# Patient Record
Sex: Male | Born: 1990 | Race: Black or African American | Hispanic: No | Marital: Single | Smoking: Never smoker
Health system: Southern US, Community
[De-identification: ages and names within clinical notes are randomized; demographics above are authoritative.]

---
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
## 2011-10-14 ENCOUNTER — Emergency Department (HOSPITAL_COMMUNITY)
Admission: EM | Admit: 2011-10-14 | Discharge: 2011-10-14 | Disposition: A | Discharge status: Home / Self Care | Payer: BC Managed Care – PPO | Attending: Emergency Medicine | Admitting: Emergency Medicine

## 2011-10-14 ENCOUNTER — Encounter (HOSPITAL_COMMUNITY): Payer: Self-pay | Admitting: *Deleted

## 2011-10-14 ENCOUNTER — Emergency Department (HOSPITAL_COMMUNITY): Payer: BC Managed Care – PPO

## 2011-10-14 DIAGNOSIS — R079 Chest pain, unspecified: Secondary | ICD-10-CM | POA: Insufficient documentation

## 2011-10-14 DIAGNOSIS — R071 Chest pain on breathing: Secondary | ICD-10-CM | POA: Insufficient documentation

## 2011-10-14 DIAGNOSIS — F17200 Nicotine dependence, unspecified, uncomplicated: Secondary | ICD-10-CM | POA: Insufficient documentation

## 2011-10-14 DIAGNOSIS — R0781 Pleurodynia: Secondary | ICD-10-CM

## 2011-10-14 MED ORDER — NAPROXEN 500 MG PO TABS: 500.0000 mg | ORAL_TABLET | Freq: Two times a day (BID) | ORAL | Status: AC

## 2011-10-14 MED ORDER — KETOROLAC TROMETHAMINE 60 MG/2ML IM SOLN
60.0000 mg | Freq: Once | INTRAMUSCULAR | Status: AC
  Administered 2011-10-14: 60 mg via INTRAMUSCULAR
  Filled 2011-10-14: qty 2

## 2011-10-14 NOTE — Discharge Instructions (Signed)
Your caregiver has diagnosed you as having chest pain that is nonspecific for one problem. This means that after looking at you and examining you and ordering tests (such as blood work, chest x-rays and EKG), your caregiver does not believe that the problem is serious enough to need watching in the hospital. This judgment is often made after testing shows no acute heart attack and you are at low risk for sudden acute heart condition. Chest pain comes from many different causes.  Seek immediate medical attention if:   You have severe chest pain, especially if the pain is crushing or pressure-like and spreads to the arms, back, neck, or jaw, or if you have sweating, nausea, shortness of breath. This is an emergency. Don't wait to see if the pain will go away. Get medical help at once. Call 911 immediately. Do not drive herself to the hospital.   Your chest pain gets worse and does not go away with rest.   You have an attack of chest pain lasting longer than usual, despite rest and treatment with the medications your caregiver has prescribed   You awaken from sleep with chest pain or shortness of breath.   You feel faint or dizzy   You have chest pain not typical of your usual pain for which you originally saw your caregiver.  You must have a repeat evaluation within 24 hours for a recheck of your heart.  Please call your doctor this morning to schedule this appointment. If you do not have a family doctor, please see the list of doctors below.  RESOURCE GUIDE  Dental Problems  Patients with Medicaid: [facility]                     [facility] [address]                                           [address] Phone:  [phone]                                                  Phone:  [phone]  If unable to pay or uninsured, contact:  [facility] or [facility] to become qualified for the adult dental clinic.  Chronic Pain  Problems Contact [facility]  [phone] Patients need to be referred by their primary care doctor.  Insufficient Money for Medicine Contact [employer]:  call "211" or [facility] 271-5999.  No Primary Care Doctor Call [facility]  832-8000 Other agencies that provide inexpensive medical care    [facility]  [phone]    [facility]  [phone]    [facility]  [phone]    [facility]  832-4777    [facility]  [phone]    [facility]  [phone]  Psychological Services [facility]  [phone] [facility]  [phone] [facility]   800 853-5163 (emergency services 641-4993)  Substance Abuse Resources [facility]  336-882-2125 [facility] 336-784-9470 [facility] 336-285-9073 [facility] 336-845-3988 Residential & Outpatient Substance Abuse Program  800-659-3381  Abuse/Neglect [place] Child Abuse Hotline (336) 641-3795 [place] [facility] Hotline 800-378-5315 (After Hours)  Emergency Shelter [facility] (336) 271-5985  [facility] (336) 275-9566 [facility] (704) 372-4663  MRSA Hotline #:   832-7006    [place] [facility]  [facility] [place]     [employer]                          [facility] [address] [place]                       [address]      [address]  [place]                                                [place]                            [place] Phone:  [phone]                                   Phone:  [phone]                 Phone:  [phone]  [facility] Phone:  [phone]  [place] Child Abuse Hotline (336) 342-1394 (336) 342-3537 (After Hours)    

## 2011-10-14 NOTE — ED Notes (Signed)
Pt states understanding of discharge instructions 

## 2011-10-14 NOTE — ED Notes (Signed)
PT c/o intermittent non radiating CP, tender to palpation. Pain exacerbated by deep breath and arm movement.

## 2011-10-14 NOTE — ED Provider Notes (Signed)
History     CSN: [id number]  Arrival date & time 10/14/11  0010   First MD Initiated Contact with Patient 10/14/11 0029      Chief Complaint  Patient presents with  . Chest Pain    (Consider location/radiation/quality/duration/timing/severity/associated sxs/prior treatment) HPI Comments: 21 year old male with a history of no significant medical problems who does smoke cigarettes, and is very athletic including plain daily basketball or cardioactive electric jogging. He states that this evening while he was at work in [employer] he felt acute onset of left-sided sharp chest pain which is worse with taking a deep breath, worse with movement of his left arm and worse with palpation of his left chest wall. He denies any swelling of his legs, trauma, travel, injuries, immobilization, hormone therapy, family history or personal history of venous thromboembolism. He also denies any early cardiac history in his family and states that he partially takes no other prescription medications, smoked some marijuana and drinks no daily alcohol. Currently the pain is mild, worse with deep breathing. He admits that this does happen intermittently in the past but is only short lived and not this intense. A family members in the room with him and states there is no significant family history of aortic dissection, aneurysm, coronary disease or sudden cardiac death.  Patient is a 21 y.o. male presenting with chest pain. The history is provided by the patient.  Chest Pain     History reviewed. No pertinent past medical history.  History reviewed. No pertinent past surgical history.  No family history on file.  History  Substance Use Topics  . Smoking status: Current Everyday Smoker  . Smokeless tobacco: Not on file  . Alcohol Use: No      Review of Systems  Cardiovascular: Positive for chest pain.  All other systems reviewed and are negative.    Allergies  Review of patient's allergies  indicates no known allergies.  Home Medications   Current Outpatient Rx  Name Route Sig Dispense Refill  . NAPROXEN 500 MG PO TABS Oral Take 1 tablet (500 mg total) by mouth 2 (two) times daily with a meal. 30 tablet 0    BP 136/96  Pulse 68  Temp(Src) 98.8 F (37.1 C) (Oral)  Resp 20  SpO2 100%  Physical Exam  Nursing note and vitals reviewed. Constitutional: He appears well-developed and well-nourished. No distress.  HENT:  Head: Normocephalic and atraumatic.  Mouth/Throat: Oropharynx is clear and moist. No oropharyngeal exudate.  Eyes: Conjunctivae and EOM are normal. Pupils are equal, round, and reactive to light. Right eye exhibits no discharge. Left eye exhibits no discharge. No scleral icterus.  Neck: Normal range of motion. Neck supple. No JVD present. No thyromegaly present.  Cardiovascular: Normal rate, regular rhythm, normal heart sounds and intact distal pulses.  Exam reveals no gallop and no friction rub.   No murmur heard. Pulmonary/Chest: Effort normal and breath sounds normal. No respiratory distress. He has no wheezes. He has no rales. He exhibits tenderness ( Reproducible tenderness in the left parasternal area and the lower pectoralis.).  Abdominal: Soft. Bowel sounds are normal. He exhibits no distension and no mass. There is no tenderness.  Musculoskeletal: Normal range of motion. He exhibits no edema and no tenderness.       Tenderness to palpation of the chest, tenderness with range of motion of the left arm into extension of the pectoralis on the left  Lymphadenopathy:    He has no cervical adenopathy.  Neurological:  He is alert. Coordination normal.  Skin: Skin is warm and dry. No rash noted. No erythema.  Psychiatric: He has a normal mood and affect. His behavior is normal.    ED Course  Procedures (including critical care time)  Labs Reviewed - No data to display Dg Chest 2 View  10/14/2011  *RADIOLOGY REPORT*  Clinical Data: Chest pain.  Shortness  of breath.  Smoker.  CHEST - 2 VIEW  Comparison: None.  Findings: Normal sized heart.  Clear lungs.  Central peribronchial thickening.  Minimal scoliosis.  IMPRESSION: Moderate central bronchitic changes.  Original Report Authenticated By: Darrol Angel, M.D.     1. Pleuritic chest pain       MDM  No peripheral edema, no tachycardia, no hypoxia, normal lung sounds. There are no signs of right heart strain on the EKG, no wrist factors for pulmonary embolism, reproducible to palpation and range of motion of the left arm on my exam. I suspect this is a chest wall syndrome versus pleurisy, chest x-ray pending to rule out occult pneumothorax.  ED ECG REPORT   Date: 10/14/2011   Rate: 83  Rhythm: normal sinus rhythm  QRS Axis: normal  Intervals: normal  ST/T Wave abnormalities: normal  Conduction Disutrbances:none  Narrative Interpretation:   Old EKG Reviewed: none available  Chest x-ray reviewed showing no signs of acute injury, pneumothorax or abnormalities. Patient has been given intramuscular Toradol, stable for discharge, likely pleurisy. Low risk for pulmonary embolism.    Discharge Prescriptions include:  Naprosyn   Vida Roller, MD 10/14/11 [phone]

## 2011-10-14 NOTE — ED Notes (Signed)
MD Miller at bedside. 

## 2011-10-14 NOTE — ED Notes (Signed)
Lt chest pain for one hour sob nausea and dizziness??? He has a history of the same never saw a doctor

## 2012-10-27 ENCOUNTER — Ambulatory Visit: Payer: No Typology Code available for payment source

## 2012-10-27 ENCOUNTER — Ambulatory Visit (INDEPENDENT_AMBULATORY_CARE_PROVIDER_SITE_OTHER): Payer: No Typology Code available for payment source | Admitting: Internal Medicine

## 2012-10-27 VITALS — BP 120/65 | HR 77 | Temp 98.1°F | Resp 18 | Ht 73.5 in | Wt 174.0 lb

## 2012-10-27 DIAGNOSIS — S93609A Unspecified sprain of unspecified foot, initial encounter: Secondary | ICD-10-CM

## 2012-10-27 DIAGNOSIS — M79675 Pain in left toe(s): Secondary | ICD-10-CM

## 2012-10-27 DIAGNOSIS — M79609 Pain in unspecified limb: Secondary | ICD-10-CM

## 2012-10-27 DIAGNOSIS — S93502A Unspecified sprain of left great toe, initial encounter: Secondary | ICD-10-CM

## 2012-10-27 MED ORDER — IBUPROFEN 600 MG PO TABS: 600.0000 mg | ORAL_TABLET | Freq: Three times a day (TID) | ORAL | Status: DC | PRN

## 2012-10-27 NOTE — Patient Instructions (Signed)
Turf Toe with Rehab Injury to the base of the big toe (first metatarsal phalangeal joint) that causes damage to the joint capsule and ligaments is known as turf toe. Turf toe commonly occurs on the bottom side of the joint. SYMPTOMS   Pain, tenderness, inflammation and/or bruising around the big toe (contusion).  Pain that worsens with movement of the big toe, specifically when raising (extending) the toe.  Inability to walk properly on the affected foot, which causes one to limp. CAUSES  A force being placed on the joint capsule and ligaments that is greater than they can withstand causes turf toe. Common mechanisms of injury include:  Repetitive and/or strenuous extension of the big toe (ie. standing on tip toes).  Explosive running starts (ie. sprinters).  "Stubbing" the big toe.  Another player landing on your foot. RISK INCREASES WITH:  Previous toe injury.  Having a long first toe.  Flat feet.  Arthritis of the great toe.  Improperly fitted shoes or shoes that are not appropriate for a given activity.  Family history of foot abnormalities.  Activities that involve explosive running starts, standing on tip toes, or jumping. PREVENTION  Wear properly fitted shoes that are appropriate for the sport or activity.  Protect the first toe by taping it to reduce motion.  Maintain physical fitness:  Strength, flexibility, and endurance.  Cardiovascular fitness. PROGNOSIS  If treated properly, the symptoms of turf toes usually resolve with non-surgical (conservative) treatment. Occasionally surgery is necessary. RELATED COMPLICATIONS  Recurrent symptoms that result in a chronic problem.  Inability to compete in athletics.  Prolonged healing time, if improperly treated or re-injured.  Other foot injuries that occur due to protecting the first toe from pain.  Loss of motion in the first toe (hallux rigidus).  Bunion (hallux valgus). TREATMENT  Treatment  initially involves resting from any activities that aggravate the symptoms, and the use of ice and medications to help reduce pain and inflammation. The use of range-of-motion exercises may help reduce pain with activity. It is important that you wear properly fitted shoes with a stiff sole and a wide toe box, in order to reduce the pressure on the first toe. Protecting your big toe by taping it to restrict movement may allow you to return to sports earlier without pain or discomfort. If the condition becomes chronic, then your caregiver may recommend a corticosteroid injection to help reduce inflammation. If symptoms persist despite non-surgical treatment, then surgery may be recommended. MEDICATION  If pain medication is necessary, then nonsteroidal anti-inflammatory medications, such as aspirin and ibuprofen, or other minor pain relievers, such as acetaminophen, are often recommended.  Do not take pain medication for 7 days before surgery.  Prescription pain relievers may be given if deemed necessary by your caregiver. Use only as directed and only as much as you need.  Ointments applied to the skin may be helpful.  Corticosteroid injections may be given by your caregiver. These injections should be reserved for the most serious cases, because they may only be given a certain number of times. HEAT AND COLD  Cold treatment (icing) relieves pain and reduces inflammation. Cold treatment should be applied for 10 to 15 minutes every 2 to 3 hours for inflammation and pain and immediately after any activity that aggravates your symptoms. Use ice packs or massage the area with a piece of ice (ice massage).  Heat treatment may be used prior to performing the stretching and strengthening activities prescribed by your caregiver, physical therapist,  or Event organiser. Use a heat pack or soak the injury in warm water. SEEK MEDICAL CARE IF:  Treatment seems to offer no benefit, or the condition  worsens.  Any medications produce adverse side effects. EXERCISES RANGE OF MOTION (ROM) AND STRETCHING EXERCISES - Turf Toe These exercises may help you when beginning to rehabilitate your injury. Your symptoms may resolve with or without further involvement from your physician, physical therapist or athletic trainer. While completing these exercises, remember:  Restoring tissue flexibility helps normal motion to return to the joints. This allows healthier, less painful movement and activity.  An effective stretch should be held for at least 30 seconds.  A stretch should never be painful. You should only feel a gentle lengthening or release in the stretched tissue. RANGE OF MOTION - Toe Extension, Flexion  Sit with your right / left leg crossed over your opposite knee.  Grasp your toes and gently pull them back toward the top of your foot. You should feel a stretch on the bottom of your toes and/or foot.  Hold this stretch for __________ seconds.  Now, gently pull your toes toward the bottom of your foot. You should feel a stretch on the top of your toes and or foot.  Hold this stretch for __________ seconds. Repeat __________ times. Complete this stretch __________ times per day. RANGE OF MOTION- Ankle Plantar Flexion  Sit with your right / left leg crossed over your opposite knee.  Use your opposite hand to pull the top of your foot and toes toward you.  You should feel a gentle stretch on the top of your foot/ankle. Hold this position for __________ seconds. Repeat __________ times. Complete __________ times per day. STRENGTHENING EXERCISES - Turf Toe These exercises may help you when beginning to rehabilitate your injury. They may resolve your symptoms with or without further involvement from your physician, physical therapist or athletic trainer. While completing these exercises, remember:  Muscles can gain both the endurance and the strength needed for everyday activities  through controlled exercises.  Complete these exercises as instructed by your physician, physical therapist or athletic trainer. Progress with the resistance and repetition exercises only as your caregiver advises.  You may experience muscle soreness or fatigue, but the pain or discomfort you are trying to eliminate should never worsen during these exercises. If this pain does worsen, stop and make certain you are following the directions exactly. If the pain is still present after adjustments, discontinue the exercise until you can discuss the trouble with your clinician. STRENGTH - Towel Curls  Sit in a chair positioned on a non-carpeted surface.  Place your foot on a towel, keeping your heel on the floor.  Pull the towel toward your heel by only curling your toes. Keep your heel on the floor.  If instructed by your physician, physical therapist or athletic trainer, add ____________________ at the end of the towel. Repeat __________ times. Complete this exercise __________ times per day. Document Released: 04/30/2005 Document Revised: 07/23/2011 Document Reviewed: 08/12/2008 [facility] Patient Information 2014 [place], [place].

## 2012-10-27 NOTE — Progress Notes (Signed)
  Subjective:    Patient ID: Timothy [name], male    DOB: Jul 03, 1990, 22 y.o.   MRN: [id number]  HPI Patient presents today with left foot pain. He was playing basketball yesterday and he rolled it forward. He states that the ball of the foot at the big toe is very painful to put pressure on it. He has been walking on the outside of the foot. Pain and swelling at 1st mtpj. No previous injury to this area.   Review of Systems neg    Objective:   Physical Exam  Vitals reviewed. Constitutional: He is oriented to person, place, and time. He appears well-developed and well-nourished.  Eyes: EOM are normal.  Pulmonary/Chest: Effort normal.  Musculoskeletal:       Left foot: He exhibits decreased range of motion, tenderness, bony tenderness and swelling. He exhibits no crepitus, no deformity and no laceration.       Feet:  1st mtpj tender/swollen  Neurological: He is alert and oriented to person, place, and time. No cranial nerve deficit. He exhibits normal muscle tone. Coordination normal.  Skin: No erythema.  Psychiatric: He has a normal mood and affect.   [facility] reading (PRIMARY) by  Dr Perrin Maltese no fx seen         Assessment & Plan:  Camwalker/RICE/Motrin 600mg  Sprain/contusion 1st MTPJ and great toe

## 2012-12-22 ENCOUNTER — Ambulatory Visit: Payer: No Typology Code available for payment source

## 2012-12-22 ENCOUNTER — Ambulatory Visit (INDEPENDENT_AMBULATORY_CARE_PROVIDER_SITE_OTHER): Payer: No Typology Code available for payment source | Admitting: Family Medicine

## 2012-12-22 VITALS — BP 110/72 | HR 66 | Temp 98.8°F | Resp 16

## 2012-12-22 DIAGNOSIS — S93409A Sprain of unspecified ligament of unspecified ankle, initial encounter: Secondary | ICD-10-CM

## 2012-12-22 DIAGNOSIS — S93401A Sprain of unspecified ligament of right ankle, initial encounter: Secondary | ICD-10-CM

## 2012-12-22 DIAGNOSIS — M25579 Pain in unspecified ankle and joints of unspecified foot: Secondary | ICD-10-CM

## 2012-12-22 DIAGNOSIS — M25571 Pain in right ankle and joints of right foot: Secondary | ICD-10-CM

## 2012-12-22 MED ORDER — HYDROCODONE-ACETAMINOPHEN 5-325 MG PO TABS: 1.0000 | ORAL_TABLET | Freq: Four times a day (QID) | ORAL | Status: DC | PRN

## 2012-12-22 NOTE — Progress Notes (Signed)
  Subjective:    Patient ID: Timothy [name], male    DOB: 08-Apr-1991, 22 y.o.   MRN: [id number] Chief Complaint  Patient presents with  . Ankle Injury    Right ankle yesterday while playing basketball    HPI   Was playing basketball yesterday night and after jumping he landed on someone else's foot and rolled in right ankle in.  Initially limped home but after he went home, swelling and pain increased so can no longer bear weight. Has a CAM boot at home but it was to painful to put on.  Very hard time sleeping due to pain.  Has been icing it overnight.  History reviewed. No pertinent past medical history. No current outpatient prescriptions on file prior to visit.   No current facility-administered medications on file prior to visit.   No Known Allergies   Review of Systems  Constitutional: Negative for fever, chills, diaphoresis, activity change and appetite change.  Musculoskeletal: Positive for joint swelling and arthralgias. Negative for gait problem.  Skin: Negative for color change, rash and wound.  Neurological: Positive for weakness. Negative for numbness.  Hematological: Negative for adenopathy. Does not bruise/bleed easily.      BP 110/72  Pulse 66  Temp(Src) 98.8 F (37.1 C)  Resp 16 Objective:   Physical Exam  Constitutional: He is oriented to person, place, and time. He appears well-developed and well-nourished. No distress.  In wheelchair, was not able to bear weight in office.  HENT:  Head: Normocephalic and atraumatic.  Eyes: No scleral icterus.  Pulmonary/Chest: Effort normal.  Musculoskeletal:       Right ankle: He exhibits decreased range of motion and swelling. He exhibits no ecchymosis and normal pulse. Tenderness. AITFL, posterior TFL and proximal fibula tenderness found. No lateral malleolus, no medial malleolus, no CF ligament and no head of 5th metatarsal tenderness found.  Neurological: He is alert and oriented to person, place, and time. Gait  abnormal.  Skin: Skin is warm and dry. He is not diaphoretic.  Psychiatric: He has a normal mood and affect. His behavior is normal.         [facility] reading (PRIMARY) by  Dr. Clelia Croft. Right ankle: No acute bony abnormality seen.  RADIOLOGY OVERREAD: Nondisplaced fracture of the distal fibular physis Assessment & Plan:  Pain in joint, ankle and foot, right - Plan: DG Ankle Complete Right  Ankle sprain, right, initial encounter Subtle distal fibular fracture - initially pt was treated w/ sprain - applied ace wrap and given crutches, advised RICE with non-weightbearing x 3d - after radiology overread was returned will change management plan to have pt wear CAM boot - he has one at home already from prior injury. Cont non-weightbearing x 5d, then touchdown weightbearing as tolerated. Recheck in 10-14d w/ repeat xray. Meds ordered this encounter  Medications  . HYDROcodone-acetaminophen (NORCO/VICODIN) 5-325 MG per tablet    Sig: Take 1 tablet by mouth every 6 (six) hours as needed for pain.    Dispense:  30 tablet    Refill:  0   DID NOT CHARGE FRACTURE CODE FOR OV AS FRACTURE NOT DIAGNOSED UNTIL AFTER OV

## 2012-12-22 NOTE — Patient Instructions (Addendum)
RICE:  R: Rest is achieved by limiting weightbearing. Use crutches until you are able to walk with a normal gait. I:   Ice or cold water immersion for 15 to 20 minutes every two to three hours for the first 48 hours or until swelling is improved, whichever comes first. C: Compression with an elastic bandage to minimize swelling should be applied early. E: Elevation - The injured ankle should be kept elevated above the level of the heart to further alleviate swelling.  Nonsteroidal antiinflammatory drugs (NSAIDs) can be used; no particular NSAID has been shown to be superior so you can using whatever you have at home - ibuprofen, aleve, motrin, advil, etc.    Exercises including pointing and flexing the foot and doing foot circles should be started early, once acute pain and swelling subside, to maintain range of motion. The intensity of rehabilitation is increased gradually.  Ankle splints or braces can limit extremes of joint motion and allow early weightbearing while protecting against reinjury.   Acute Ankle Sprain with Phase I Rehab An acute ankle sprain is a partial or complete tear in one or more of the ligaments of the ankle due to traumatic injury. The severity of the injury depends on both the the number of ligaments sprained and the grade of sprain. There are 3 grades of sprains.   A grade 1 sprain is a mild sprain. There is a slight pull without obvious tearing. There is no loss of strength, and the muscle and ligament are the correct length.  A grade 2 sprain is a moderate sprain. There is tearing of fibers within the substance of the ligament where it connects two bones or two cartilages. The length of the ligament is increased, and there is usually decreased strength.  A grade 3 sprain is a complete rupture of the ligament and is uncommon. In addition to the grade of sprain, there are three types of ankle sprains.  Lateral ankle sprains: This is a sprain of one or more of the three  ligaments on the outer side (lateral) of the ankle. These are the most common sprains. Medial ankle sprains: There is one large triangular ligament of the inner side (medial) of the ankle that is susceptible to injury. Medial ankle sprains are less common. Syndesmosis, "high ankle," sprains: The syndesmosis is the ligament that connects the two bones of the lower leg. Syndesmosis sprains usually only occur with very severe ankle sprains. SYMPTOMS  Pain, tenderness, and swelling in the ankle, starting at the side of injury that may progress to the whole ankle and foot with time.  "Pop" or tearing sensation at the time of injury.  Bruising that may spread to the heel.  Impaired ability to walk soon after injury. CAUSES   Acute ankle sprains are caused by trauma placed on the ankle that temporarily forces or pries the anklebone (talus) out of its normal socket.  Stretching or tearing of the ligaments that normally hold the joint in place (usually due to a twisting injury). RISK INCREASES WITH:  Previous ankle sprain.  Sports in which the foot may land awkwardly (ie. basketball, volleyball, or soccer) or walking or running on uneven or rough surfaces.  Shoes with inadequate support to prevent sideways motion when stress occurs.  Poor strength and flexibility.  Poor balance skills.  Contact sports. PREVENTION   Warm up and stretch properly before activity.  Maintain physical fitness:  Ankle and leg flexibility, muscle strength, and endurance.  Cardiovascular fitness.  Balance training activities.  Use proper technique and have a coach correct improper technique.  Taping, protective strapping, bracing, or high-top tennis shoes may help prevent injury. Initially, tape is best; however, it loses most of its support function within 10 to 15 minutes.  Wear proper fitted protective shoes (High-top shoes with taping or bracing is more effective than either alone).  Provide the  ankle with support during sports and practice activities for 12 months following injury. PROGNOSIS   If treated properly, ankle sprains can be expected to recover completely; however, the length of recovery depends on the degree of injury.  A grade 1 sprain usually heals enough in 5 to 7 days to allow modified activity and requires an average of 6 weeks to heal completely.  A grade 2 sprain requires 6 to 10 weeks to heal completely.  A grade 3 sprain requires 12 to 16 weeks to heal.  A syndesmosis sprain often takes more than 3 months to heal. RELATED COMPLICATIONS   Frequent recurrence of symptoms may result in a chronic problem. Appropriately addressing the problem the first time decreases the frequency of recurrence and optimizes healing time. Severity of the initial sprain does not predict the likelihood of later instability.  Injury to other structures (bone, cartilage, or tendon).  A chronically unstable or arthritic ankle joint is a possiblity with repeated sprains. TREATMENT Treatment initially involves the use of ice, medication, and compression bandages to help reduce pain and inflammation. Ankle sprains are usually immobilized in a walking cast or boot to allow for healing. Crutches may be recommended to reduce pressure on the injury. After immobilization, strengthening and stretching exercises may be necessary to regain strength and a full range of motion. Surgery is rarely needed to treat ankle sprains. MEDICATION   Nonsteroidal anti-inflammatory medications, such as aspirin and ibuprofen (do not take for the first 3 days after injury or within 7 days before surgery), or other minor pain relievers, such as acetaminophen, are often recommended. Take these as directed by your caregiver. Contact your caregiver immediately if any bleeding, stomach upset, or signs of an allergic reaction occur from these medications.  Ointments applied to the skin may be helpful.  Pain relievers  may be prescribed as necessary by your caregiver. Do not take prescription pain medication for longer than 4 to 7 days. Use only as directed and only as much as you need. HEAT AND COLD  Cold treatment (icing) is used to relieve pain and reduce inflammation for acute and chronic cases. Cold should be applied for 10 to 15 minutes every 2 to 3 hours for inflammation and pain and immediately after any activity that aggravates your symptoms. Use ice packs or an ice massage.  Heat treatment may be used before performing stretching and strengthening activities prescribed by your caregiver. Use a heat pack or a warm soak. SEEK IMMEDIATE MEDICAL CARE IF:   Pain, swelling, or bruising worsens despite treatment.  You experience pain, numbness, discoloration, or coldness in the foot or toes.  New, unexplained symptoms develop (drugs used in treatment may produce side effects.) EXERCISES  PHASE I EXERCISES RANGE OF MOTION (ROM) AND STRETCHING EXERCISES - Ankle Sprain, Acute Phase I, Weeks 1 to 2 These exercises may help you when beginning to restore flexibility in your ankle. You will likely work on these exercises for the 1 to 2 weeks after your injury. Once your physician, physical therapist, or athletic trainer sees adequate progress, he or she will advance your exercises. While  completing these exercises, remember:   Restoring tissue flexibility helps normal motion to return to the joints. This allows healthier, less painful movement and activity.  An effective stretch should be held for at least 30 seconds.  A stretch should never be painful. You should only feel a gentle lengthening or release in the stretched tissue. RANGE OF MOTION - Dorsi/Plantar Flexion  While sitting with your right / left knee straight, draw the top of your foot upwards by flexing your ankle. Then reverse the motion, pointing your toes downward.  Hold each position for __________ seconds.  After completing your first set  of exercises, repeat this exercise with your knee bent. Repeat __________ times. Complete this exercise __________ times per day.  RANGE OF MOTION - Ankle Alphabet  Imagine your right / left big toe is a pen.  Keeping your hip and knee still, write out the entire alphabet with your "pen." Make the letters as large as you can without increasing any discomfort. Repeat __________ times. Complete this exercise __________ times per day.  STRENGTHENING EXERCISES - Ankle Sprain, Acute -Phase I, Weeks 1 to 2 These exercises may help you when beginning to restore strength in your ankle. You will likely work on these exercises for 1 to 2 weeks after your injury. Once your physician, physical therapist, or athletic trainer sees adequate progress, he or she will advance your exercises. While completing these exercises, remember:   Muscles can gain both the endurance and the strength needed for everyday activities through controlled exercises.  Complete these exercises as instructed by your physician, physical therapist, or athletic trainer. Progress the resistance and repetitions only as guided.  You may experience muscle soreness or fatigue, but the pain or discomfort you are trying to eliminate should never worsen during these exercises. If this pain does worsen, stop and make certain you are following the directions exactly. If the pain is still present after adjustments, discontinue the exercise until you can discuss the trouble with your clinician. STRENGTH - Dorsiflexors  Secure a rubber exercise band/tubing to a fixed object (ie. table, pole) and loop the other end around your right / left foot.  Sit on the floor facing the fixed object. The band/tubing should be slightly tense when your foot is relaxed.  Slowly draw your foot back toward you using your ankle and toes.  Hold this position for __________ seconds. Slowly release the tension in the band and return your foot to the starting  position. Repeat __________ times. Complete this exercise __________ times per day.  STRENGTH - Plantar-flexors   Sit with your right / left leg extended. Holding onto both ends of a rubber exercise band/tubing, loop it around the ball of your foot. Keep a slight tension in the band.  Slowly push your toes away from you, pointing them downward.  Hold this position for __________ seconds. Return slowly, controlling the tension in the band/tubing. Repeat __________ times. Complete this exercise __________ times per day.  STRENGTH - Ankle Eversion  Secure one end of a rubber exercise band/tubing to a fixed object (table, pole). Loop the other end around your foot just before your toes.  Place your fists between your knees. This will focus your strengthening at your ankle.  Drawing the band/tubing across your opposite foot, slowly, pull your little toe out and up. Make sure the band/tubing is positioned to resist the entire motion.  Hold this position for __________ seconds. Have your muscles resist the band/tubing as it slowly pulls  your foot back to the starting position.  Repeat __________ times. Complete this exercise __________ times per day.  STRENGTH - Ankle Inversion  Secure one end of a rubber exercise band/tubing to a fixed object (table, pole). Loop the other end around your foot just before your toes.  Place your fists between your knees. This will focus your strengthening at your ankle.  Slowly, pull your big toe up and in, making sure the band/tubing is positioned to resist the entire motion.  Hold this position for __________ seconds.  Have your muscles resist the band/tubing as it slowly pulls your foot back to the starting position. Repeat __________ times. Complete this exercises __________ times per day.  STRENGTH - Towel Curls  Sit in a chair positioned on a non-carpeted surface.  Place your right / left foot on a towel, keeping your heel on the floor.  Pull the  towel toward your heel by only curling your toes. Keep your heel on the floor.  If instructed by your physician, physical therapist, or athletic trainer, add weight to the end of the towel. Repeat __________ times. Complete this exercise __________ times per day. Document Released: 11/29/2004 Document Revised: 07/23/2011 Document Reviewed: 08/12/2008 [facility] Patient Information 2014 [place], [place].

## 2012-12-24 ENCOUNTER — Telehealth: Payer: Self-pay | Admitting: Family Medicine

## 2012-12-24 NOTE — Telephone Encounter (Signed)
Notified Pt's mother that [name] has a fibula Fx and needs to be in his boot non-weight bearing until this weekend. Pt's mother verbalized understanding about the boot and crutches and agreed to try and reach her son. I also notified her that Timothy [name] can begin to put weight down on his foot this weekend and he needs to [facility] in 10-14 days for a recheck. [name]

## 2013-01-13 ENCOUNTER — Encounter: Payer: Self-pay | Admitting: Family Medicine

## 2013-01-29 ENCOUNTER — Telehealth: Payer: Self-pay | Admitting: Family Medicine

## 2013-01-29 NOTE — Telephone Encounter (Signed)
Called Pt on cell and home. No answer. Sent certified letter to return to clinic asap. [name]

## 2013-06-02 ENCOUNTER — Ambulatory Visit: Payer: No Typology Code available for payment source

## 2013-06-02 ENCOUNTER — Ambulatory Visit (INDEPENDENT_AMBULATORY_CARE_PROVIDER_SITE_OTHER): Payer: No Typology Code available for payment source | Admitting: Family Medicine

## 2013-06-02 ENCOUNTER — Encounter: Payer: Self-pay | Admitting: Family Medicine

## 2013-06-02 VITALS — BP 118/68 | HR 90 | Temp 98.5°F | Resp 17 | Ht 74.0 in | Wt 183.0 lb

## 2013-06-02 DIAGNOSIS — R079 Chest pain, unspecified: Secondary | ICD-10-CM

## 2013-06-02 MED ORDER — HYDROCODONE-ACETAMINOPHEN 5-325 MG PO TABS: 1.0000 | ORAL_TABLET | Freq: Four times a day (QID) | ORAL | Status: AC | PRN

## 2013-06-02 NOTE — Progress Notes (Signed)
23 [facility] young adult at [employer] who was playing basketball yesterday and took a shoulder to the chest and has had deep substernal chest pain since, made worse by deep breath or getting and sitting down.  Also more noticeable when using his arms.  Objective:  NAD Chest:  Tender mid sternum, lungs clear, ribs nontender Heart:  Reg, no murmur No sternal swelling [facility] reading (PRIMARY) by  Dr. Milus GlazierLauenstein:  Unfused sternal bones, otherwise unremarkable.  EKG: NSR  Chest pain - Plan: DG Chest 2 View, DG Chest 2 View, HYDROcodone-acetaminophen (NORCO) 5-325 MG per tablet Contusion to sternum   Signed, Elvina SidleKurt Helio Lack, MD

## 2014-04-19 ENCOUNTER — Ambulatory Visit (INDEPENDENT_AMBULATORY_CARE_PROVIDER_SITE_OTHER): Payer: Self-pay | Admitting: Emergency Medicine

## 2014-04-19 VITALS — BP 129/73 | HR 60 | Temp 98.1°F | Resp 18 | Ht 74.0 in | Wt 179.0 lb

## 2014-04-19 DIAGNOSIS — Z130 Encounter for screening for diseases of the blood and blood-forming organs and certain disorders involving the immune mechanism: Secondary | ICD-10-CM

## 2014-04-19 DIAGNOSIS — Z0189 Encounter for other specified special examinations: Secondary | ICD-10-CM

## 2014-04-19 DIAGNOSIS — Z025 Encounter for examination for participation in sport: Secondary | ICD-10-CM

## 2014-04-19 DIAGNOSIS — Z114 Encounter for screening for human immunodeficiency virus [HIV]: Secondary | ICD-10-CM

## 2014-04-19 LAB — POCT CBC
GRANULOCYTE PERCENT: 43.7 % (ref 37–80)
HCT, POC: 48.1 % (ref 43.5–53.7)
Hemoglobin: 15.7 g/dL (ref 14.1–18.1)
Lymph, poc: 1.4 (ref 0.6–3.4)
MCH: 28.9 pg (ref 27–31.2)
MCHC: 32.5 g/dL (ref 31.8–35.4)
MCV: 88.9 fL (ref 80–97)
MID (CBC): 0.3 (ref 0–0.9)
MPV: 7.6 fL (ref 0–99.8)
PLATELET COUNT, POC: 220 10*3/uL (ref 142–424)
POC Granulocyte: 1.4 — AB (ref 2–6.9)
POC LYMPH %: 46.2 % (ref 10–50)
POC MID %: 10.1 % (ref 0–12)
RBC: 5.41 M/uL (ref 4.69–6.13)
RDW, POC: 13.7 %
WBC: 3.1 10*3/uL — AB (ref 4.6–10.2)

## 2014-04-19 LAB — HIV ANTIBODY (ROUTINE TESTING W REFLEX): HIV: NONREACTIVE

## 2014-04-19 NOTE — Progress Notes (Addendum)
   Subjective:    Patient ID: Timothy [name], male    DOB: 12/11/1990, 23 y.o.   MRN: [id number] This chart was scribed for Lesle ChrisSteven Drinda Belgard, MD by Jolene Provostobert Halas, Medical Scribe. This patient was seen in Room 3 and the patient's care was started at 11:07 AM.  Chief Complaint  Patient presents with  . Annual Exam    sports    HPI HPI Comments: Timothy [name] is a 23 y.o. male who [facility] for a sports physical. Pt has no past surgical hx, or serious sports injuries. Pt does not smoke or drink. Pt works out regularly.     Review of Systems  Constitutional: Negative for fever and chills.  HENT: Negative for congestion.   Respiratory: Negative for shortness of breath.   Cardiovascular: Negative for palpitations and leg swelling.  Gastrointestinal: Negative for abdominal pain.       Objective:   Physical Exam  Constitutional: He is oriented to person, place, and time. He appears well-developed and well-nourished.  HENT:  Head: Normocephalic and atraumatic.  Eyes: Pupils are equal, round, and reactive to light.  Neck: Neck supple.  Cardiovascular: Normal rate and regular rhythm.   Pulmonary/Chest: Effort normal and breath sounds normal. No respiratory distress.  Neurological: He is alert and oriented to person, place, and time.  Skin: Skin is warm and dry.  Psychiatric: He has a normal mood and affect. His behavior is normal.  Nursing note and vitals reviewed.  Results for orders placed or performed in visit on 04/19/14  POCT CBC  Result Value Ref Range   WBC 3.1 (A) 4.6 - 10.2 K/uL   Lymph, poc 1.4 0.6 - 3.4   POC LYMPH PERCENT 46.2 10 - 50 %L   MID (cbc) 0.3 0 - 0.9   POC MID % 10.1 0 - 12 %M   POC Granulocyte 1.4 (A) 2 - 6.9   Granulocyte percent 43.7 37 - 80 %G   RBC 5.41 4.69 - 6.13 M/uL   Hemoglobin 15.7 14.1 - 18.1 g/dL   HCT, POC 08.648.1 57.843.5 - 53.7 %   MCV 88.9 80 - 97 fL   MCH, POC 28.9 27 - 31.2 pg   MCHC 32.5 31.8 - 35.4 g/dL   RDW, POC 46.913.7 %    Platelet Count, POC 220 142 - 424 K/uL   MPV 7.6 0 - 99.8 fL       Assessment & Plan:  White blood count is slightly low with a normal hemoglobin. Would suggest repeat CBC in 3-6 months. HIV test was done.. Patient is cleared for strenuous exercise. A sickle test was done and pending.I personally performed the services described in this documentation, which was scribed in my presence. The recorded information has been reviewed and is accurate.

## 2014-04-20 LAB — SICKLE CELL SCREEN: Sickle Cell Screen: NEGATIVE

## 2014-04-21 ENCOUNTER — Encounter: Payer: Self-pay | Admitting: Radiology

## 2015-09-23 ENCOUNTER — Encounter (HOSPITAL_COMMUNITY): Payer: Self-pay | Admitting: Vascular Surgery

## 2015-09-23 ENCOUNTER — Emergency Department (HOSPITAL_COMMUNITY)
Admission: EM | Admit: 2015-09-23 | Discharge: 2015-09-23 | Disposition: A | Discharge status: Home / Self Care | Payer: 59 | Attending: Emergency Medicine | Admitting: Emergency Medicine

## 2015-09-23 ENCOUNTER — Emergency Department (HOSPITAL_COMMUNITY): Payer: 59

## 2015-09-23 DIAGNOSIS — R0782 Intercostal pain: Secondary | ICD-10-CM | POA: Diagnosis not present

## 2015-09-23 DIAGNOSIS — R079 Chest pain, unspecified: Secondary | ICD-10-CM | POA: Diagnosis present

## 2015-09-23 DIAGNOSIS — M546 Pain in thoracic spine: Secondary | ICD-10-CM | POA: Insufficient documentation

## 2015-09-23 LAB — BASIC METABOLIC PANEL
ANION GAP: 12 (ref 5–15)
BUN: 7 mg/dL (ref 6–20)
CO2: 23 mmol/L (ref 22–32)
CREATININE: 1.45 mg/dL — AB (ref 0.61–1.24)
Calcium: 9.6 mg/dL (ref 8.9–10.3)
Chloride: 106 mmol/L (ref 101–111)
GFR calc non Af Amer: >60 mL/min (ref >60)
Glucose, Bld: 87 mg/dL (ref 65–99)
Potassium: 4.2 mmol/L (ref 3.5–5.1)
Sodium: 141 mmol/L (ref 135–145)

## 2015-09-23 LAB — CBC
HEMATOCRIT: 44.9 % (ref 39.0–52.0)
HEMOGLOBIN: 15.2 g/dL (ref 13.0–17.0)
MCH: 29.2 pg (ref 26.0–34.0)
MCHC: 33.9 g/dL (ref 30.0–36.0)
MCV: 86.2 fL (ref 78.0–100.0)
PLATELETS: 204 10*3/uL (ref 150–400)
RBC: 5.21 MIL/uL (ref 4.22–5.81)
RDW: 13.2 % (ref 11.5–15.5)
WBC: 3.1 10*3/uL — AB (ref 4.0–10.5)

## 2015-09-23 LAB — I-STAT TROPONIN, ED: Troponin i, poc: 0.01 ng/mL (ref 0.00–0.08)

## 2015-09-23 NOTE — ED Provider Notes (Signed)
CSN: [id number]     Arrival date & time 09/23/15  1121 History   First MD Initiated Contact with Patient 09/23/15 1230     Chief Complaint  Patient presents with  . Chest Pain     (Consider location/radiation/quality/duration/timing/severity/associated sxs/prior Treatment) HPI   Timothy [name] is a 25 y.o. male, patient with no pertinent past medical history, presenting to the ED with chest discomfort that began two days ago. Patient states he was doing an intense workout performing bicep curls. Pain began in his upper back between his shoulder blades and then later began in the center of his chest. Worse with deep breathing and sneezing. Rates the pain at 5/10, feels like a pressure/sharp, nonradiating. Hasn't taken medicine for the pain. No family history of SCD. Patient denies nausea/vomiting, cough, fever/chills, shortness of breath, or any other complaints.  History reviewed. No pertinent past medical history. History reviewed. No pertinent past surgical history. No family history on file. Social History  Substance Use Topics  . Smoking status: Never [name]   . Smokeless tobacco: None  . Alcohol Use: No    Review of Systems  Constitutional: Negative for fever and chills.  Respiratory: Negative for cough and shortness of breath.   Cardiovascular: Positive for chest pain.  Gastrointestinal: Negative for nausea, vomiting and abdominal pain.  Musculoskeletal: Positive for back pain.  Neurological: Negative for dizziness and light-headedness.  All other systems reviewed and are negative.     Allergies  Review of patient's allergies indicates no known allergies.  Home Medications   Prior to Admission medications   Medication Sig Start Date End Date Taking? Authorizing Provider  HYDROcodone-acetaminophen (NORCO) 5-325 MG per tablet Take 1 tablet by mouth every 6 (six) hours as needed for moderate pain. Patient not taking: Reported on 04/19/2014 06/02/13   Elvina SidleKurt Lauenstein, MD    BP 135/73 mmHg  Pulse 65  Temp(Src) 97.8 F (36.6 C) (Oral)  Resp 18  Ht 6\' 2"  (1.88 m)  Wt 83.008 kg  BMI 23.49 kg/m2  SpO2 100% Physical Exam  Constitutional: He appears well-developed and well-nourished. No distress.  HENT:  Head: Normocephalic and atraumatic.  Eyes: Conjunctivae are normal. Pupils are equal, round, and reactive to light.  Neck: Neck supple.  Cardiovascular: Normal rate, regular rhythm, normal heart sounds and intact distal pulses.   Pulmonary/Chest: Effort normal and breath sounds normal. No respiratory distress.  Patient voices worsening pain with deep breathing or any expansion of the chest.  Abdominal: Soft. There is no tenderness. There is no guarding.  Musculoskeletal: He exhibits no edema or tenderness.  Lymphadenopathy:    He has no cervical adenopathy.  Neurological: He is alert.  Skin: Skin is warm and dry. He is not diaphoretic.  Psychiatric: He has a normal mood and affect. His behavior is normal.  Nursing note and vitals reviewed.   ED Course  Procedures (including critical care time) Labs Review Labs Reviewed  BASIC METABOLIC PANEL - Abnormal; Notable for the following:    Creatinine, Ser 1.45 (*)    All other components within normal limits  CBC - Abnormal; Notable for the following:    WBC 3.1 (*)    All other components within normal limits  I-STAT TROPOININ, ED   WBC  Date Value Ref Range Status  09/23/2015 3.1* 4.0 - 10.5 K/uL Final  04/19/2014 3.1* 4.6 - 10.2 K/uL Final     Imaging Review Dg Chest 2 View  09/23/2015  CLINICAL DATA:  Chest pain for 3  days EXAM: CHEST  2 VIEW COMPARISON:  June 02, 2013 FINDINGS: Lungs are clear. Heart size and pulmonary vascularity are normal. No adenopathy. No pneumothorax. No bone lesions. IMPRESSION: No edema or consolidation. Electronically Signed   By: Bretta Bang III M.D.   On: 09/23/2015 12:17   I have personally reviewed and evaluated these images and lab results as part of  my medical decision-making.   EKG Interpretation None      MDM   Final diagnoses:  Intercostal pain    Timothy [name] presents with chest discomfort that began 2 days ago.  Patient has no red flag symptoms. Suspect muscle strain versus costochondritis. Suspect patient's increased creatinine is due to the chronic use of creatine without proper hydration. Patient was instructed on the importance of adequate hydration, especially when using supplements like creatine. Patient advised on treatment and return precautions. Patient voiced understanding of these instructions and is comfortable with discharge.   Filed Vitals:   09/23/15 1132 09/23/15 1251  BP: 129/74 135/73  Pulse: 98 65  Temp: 98.2 F (36.8 C) 97.8 F (36.6 C)  TempSrc: Oral Oral  Resp: 20 18  Height:  (1.88 m)   Weight: 83.008 kg   SpO2: 99% 100%      Anselm Pancoast, PA-C 09/23/15 1343  Gwyneth Sprout, MD 09/25/15 2028

## 2015-09-23 NOTE — Discharge Instructions (Signed)
You have been seen today for chest pain. Your imaging and lab tests showed no abnormalities. It is likely that the source of your pain is due to inflammation between the ribs, called costochondritis. Take 800 mg of ibuprofen every 8 hours or 500 mg of naproxen every 12 hours for the next 3 days. Take these anti-inflammatory medications with food to avoid upset stomach. Make sure to stay well hydrated. Follow up with PCP as needed. Return to ED should symptoms worsen.

## 2015-09-23 NOTE — ED Notes (Signed)
Pt reports to the ED for eval of CP. States he has been lifting at the gym recently and states that the pain started in his back and has come around to his chest. Describes the pain as a sharpness. Worse with deep breaths, movement, and cough/sneezing. Denies any N/V, lightheadedness, or dizziness. Reports some SOB with exertion. Hx of same 3 years ago but cannot remember dx. Pt A&Ox4, resp e/u, and skin warm and dry.

## 2016-04-07 ENCOUNTER — Ambulatory Visit: Payer: Self-pay

## 2016-05-17 ENCOUNTER — Emergency Department (HOSPITAL_COMMUNITY)
Admission: EM | Admit: 2016-05-17 | Discharge: 2016-05-17 | Disposition: A | Discharge status: Home / Self Care | Payer: 59 | Attending: Emergency Medicine | Admitting: Emergency Medicine

## 2016-05-17 ENCOUNTER — Emergency Department (HOSPITAL_COMMUNITY): Payer: 59

## 2016-05-17 ENCOUNTER — Encounter (HOSPITAL_COMMUNITY): Payer: Self-pay | Admitting: Emergency Medicine

## 2016-05-17 DIAGNOSIS — M79645 Pain in left finger(s): Secondary | ICD-10-CM | POA: Diagnosis present

## 2016-05-17 DIAGNOSIS — M20021 Boutonniere deformity of right finger(s): Secondary | ICD-10-CM | POA: Insufficient documentation

## 2016-05-17 DIAGNOSIS — Y9367 Activity, basketball: Secondary | ICD-10-CM | POA: Insufficient documentation

## 2016-05-17 DIAGNOSIS — W231XXA Caught, crushed, jammed, or pinched between stationary objects, initial encounter: Secondary | ICD-10-CM | POA: Diagnosis not present

## 2016-05-17 DIAGNOSIS — Y929 Unspecified place or not applicable: Secondary | ICD-10-CM | POA: Diagnosis not present

## 2016-05-17 DIAGNOSIS — Y999 Unspecified external cause status: Secondary | ICD-10-CM | POA: Insufficient documentation

## 2016-05-17 NOTE — ED Triage Notes (Signed)
Pt jammed R ring finger 2 months ago. Since then distal joint has been locked in hyperextension and middle joint has been locked in flexion. Has been to an UC 2x for same with no relief.

## 2016-05-17 NOTE — Discharge Instructions (Signed)
Read the information below.  You may return to the Emergency Department at any time for worsening condition or any new symptoms that concern you.  If you develop uncontrolled pain, weakness or numbness of the extremity, severe discoloration of the skin, or you are unable to feel or move your finger, return to the ER for a recheck.

## 2016-05-17 NOTE — ED Notes (Signed)
Bed: WTR7 Expected date:  Expected time:  Means of arrival:  Comments: 

## 2016-05-17 NOTE — ED Provider Notes (Signed)
[facility]-EMERGENCY DEPT Provider Note   CSN: [id number] Arrival date & time: 05/17/16  1015  By signing my name below, I, Majel Homereyton Lee, attest that this documentation has been prepared under the direction and in the presence of [facility] HospitalEmily Dietrich Samuelson, PA-C . Electronically Signed: Majel HomerPeyton Lee, Scribe. 05/17/2016. 10:44 AM .  History   Chief Complaint Chief Complaint  Patient presents with  . Finger Injury   The history is provided by the patient. No language interpreter was used.   HPI Comments: Timothy [name] is a 26 y.o. male who presents to the Emergency Department complaining of gradually worsening, right fourth digit pain s/p an injury that occurred ~2 months ago. Pt reports he was playing basketball when he attempted to pick up a bouncing ball when he suddenly "jammed" the tip of his fourth finger. He notes associated swelling to his middle joint and states his pain is exacerbated with flexion of his finger and palpation. He reports he is only able to "partially bend" his fourth finger and his middle joint is "locked in place." [facility] within the past month for his pain in which he received imaging and was prescribed steroids and told to ice his finger with no relief. He notes he did not receive any form of splint for his finger at these previous appointments. Pt reports he was given a referral for a hand specialist but was unable to make an appointment until 06/07/16 which is why he visited the ED today. He denies numbness in his extremities. Denies other injury.   History reviewed. No pertinent past medical history.  There are no active problems to display for this patient.  History reviewed. No pertinent surgical history.  Home Medications    Prior to Admission medications   Medication Sig Start Date End Date Taking? Authorizing Provider  HYDROcodone-acetaminophen (NORCO) 5-325 MG per tablet Take 1 tablet by mouth every 6 (six) hours as needed for moderate  pain. Patient not taking: Reported on 04/19/2014 06/02/13   Elvina SidleKurt Lauenstein, MD   Family History History reviewed. No pertinent family history.  Social History Social History  Substance Use Topics  . Smoking status: Never Smoker  . Smokeless tobacco: Not on file  . Alcohol use No   Allergies   Patient has no known allergies.  Review of Systems Review of Systems  Constitutional: Negative for fever.  Musculoskeletal: Positive for arthralgias and joint swelling.  Skin: Negative for color change, pallor, rash and wound.  Allergic/Immunologic: Negative for immunocompromised state.  Neurological: Positive for weakness. Negative for numbness.  Hematological: Does not bruise/bleed easily.  Psychiatric/Behavioral: Negative for self-injury.   Physical Exam Updated Vital Signs BP 135/84   Pulse 81   Temp 97.6 F (36.4 C) (Oral)   Resp 16   SpO2 95%   Physical Exam  Constitutional: He appears well-developed and well-nourished. No distress.  HENT:  Head: Normocephalic and atraumatic.  Neck: Neck supple.  Pulmonary/Chest: Effort normal.  Musculoskeletal:  Right fourth finger with hyperextension of the DIP, inability to flex at that joint. Soft swelling over the PIP, no erythema or warmth, slight decreased ROM of PIP. MCP is normal.   Neurological: He is alert.  Skin: He is not diaphoretic.  Nursing note and vitals reviewed.    ED Treatments / Results  Labs (all labs ordered are listed, but only abnormal results are displayed) Labs Reviewed - No data to display  EKG  EKG Interpretation None  Radiology Dg Finger Ring Right  Result Date: 05/17/2016 CLINICAL DATA:  Injury. EXAM: RIGHT RING FINGER 2+V COMPARISON:  No prior. FINDINGS: No acute bony or joint abnormality identified. No evidence of fracture dislocation. Soft tissue swelling noted. No radiopaque foreign body . IMPRESSION: Soft tissue swelling. No radiopaque foreign body. No acute bony abnormality identified.  No evidence of fracture or dislocation. Electronically Signed   By: Maisie Fus  Register   On: 05/17/2016 11:05    Procedures Procedures (including critical care time)  Medications Ordered in ED Medications - No data to display  DIAGNOSTIC STUDIES:  Oxygen Saturation is 95% on RA, normal by my interpretation.    COORDINATION OF CARE:  10:35 AM Discussed treatment plan with pt at bedside and pt agreed to plan.  Initial Impression / Assessment and Plan / ED Course  I have reviewed the triage vital signs and the nursing notes.  Pertinent labs & imaging results that were available during my care of the patient were reviewed by me and considered in my medical decision making (see chart for details).  Clinical Course as of May 18 1311  Thu May 17, 2016  1115 Pt re-evaluated and informed of negative imaging results. Splint will be applied to finger.   [PL]    Clinical Course User Index [PL] Majel Homer    Afebrile, nontoxic patient with injury to his right 4th finger while playing basketball.   Xray negative.  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  Placed in splint, advised to keep it on at all times.    D/C home with hand surgery follow up (Dr Merlyn Lot).   Discussed result, findings, treatment, and follow up  with patient.  Pt given return precautions.  Pt verbalizes understanding and agrees with plan.      I personally performed the services described in this documentation, which was scribed in my presence. The recorded information has been reviewed and is accurate.   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

## 2021-05-22 ENCOUNTER — Ambulatory Visit
Admission: RE | Admit: 2021-05-22 | Discharge: 2021-05-22 | Disposition: A | Discharge status: Home / Self Care | Payer: Self-pay | Source: Ambulatory Visit | Attending: Nurse Practitioner | Admitting: Nurse Practitioner

## 2021-05-22 ENCOUNTER — Other Ambulatory Visit: Payer: Self-pay | Admitting: Nurse Practitioner

## 2021-05-22 ENCOUNTER — Other Ambulatory Visit: Payer: Self-pay

## 2021-05-22 DIAGNOSIS — Z021 Encounter for pre-employment examination: Secondary | ICD-10-CM

## 2022-08-16 IMAGING — CR DG CHEST 1V
1 series · 1 of 1 positions shown · non-contrast
Comparison: 09/23/2015

CLINICAL DATA: Pre-employment exam

EXAM:
CHEST  1 VIEW

[w chest pa]
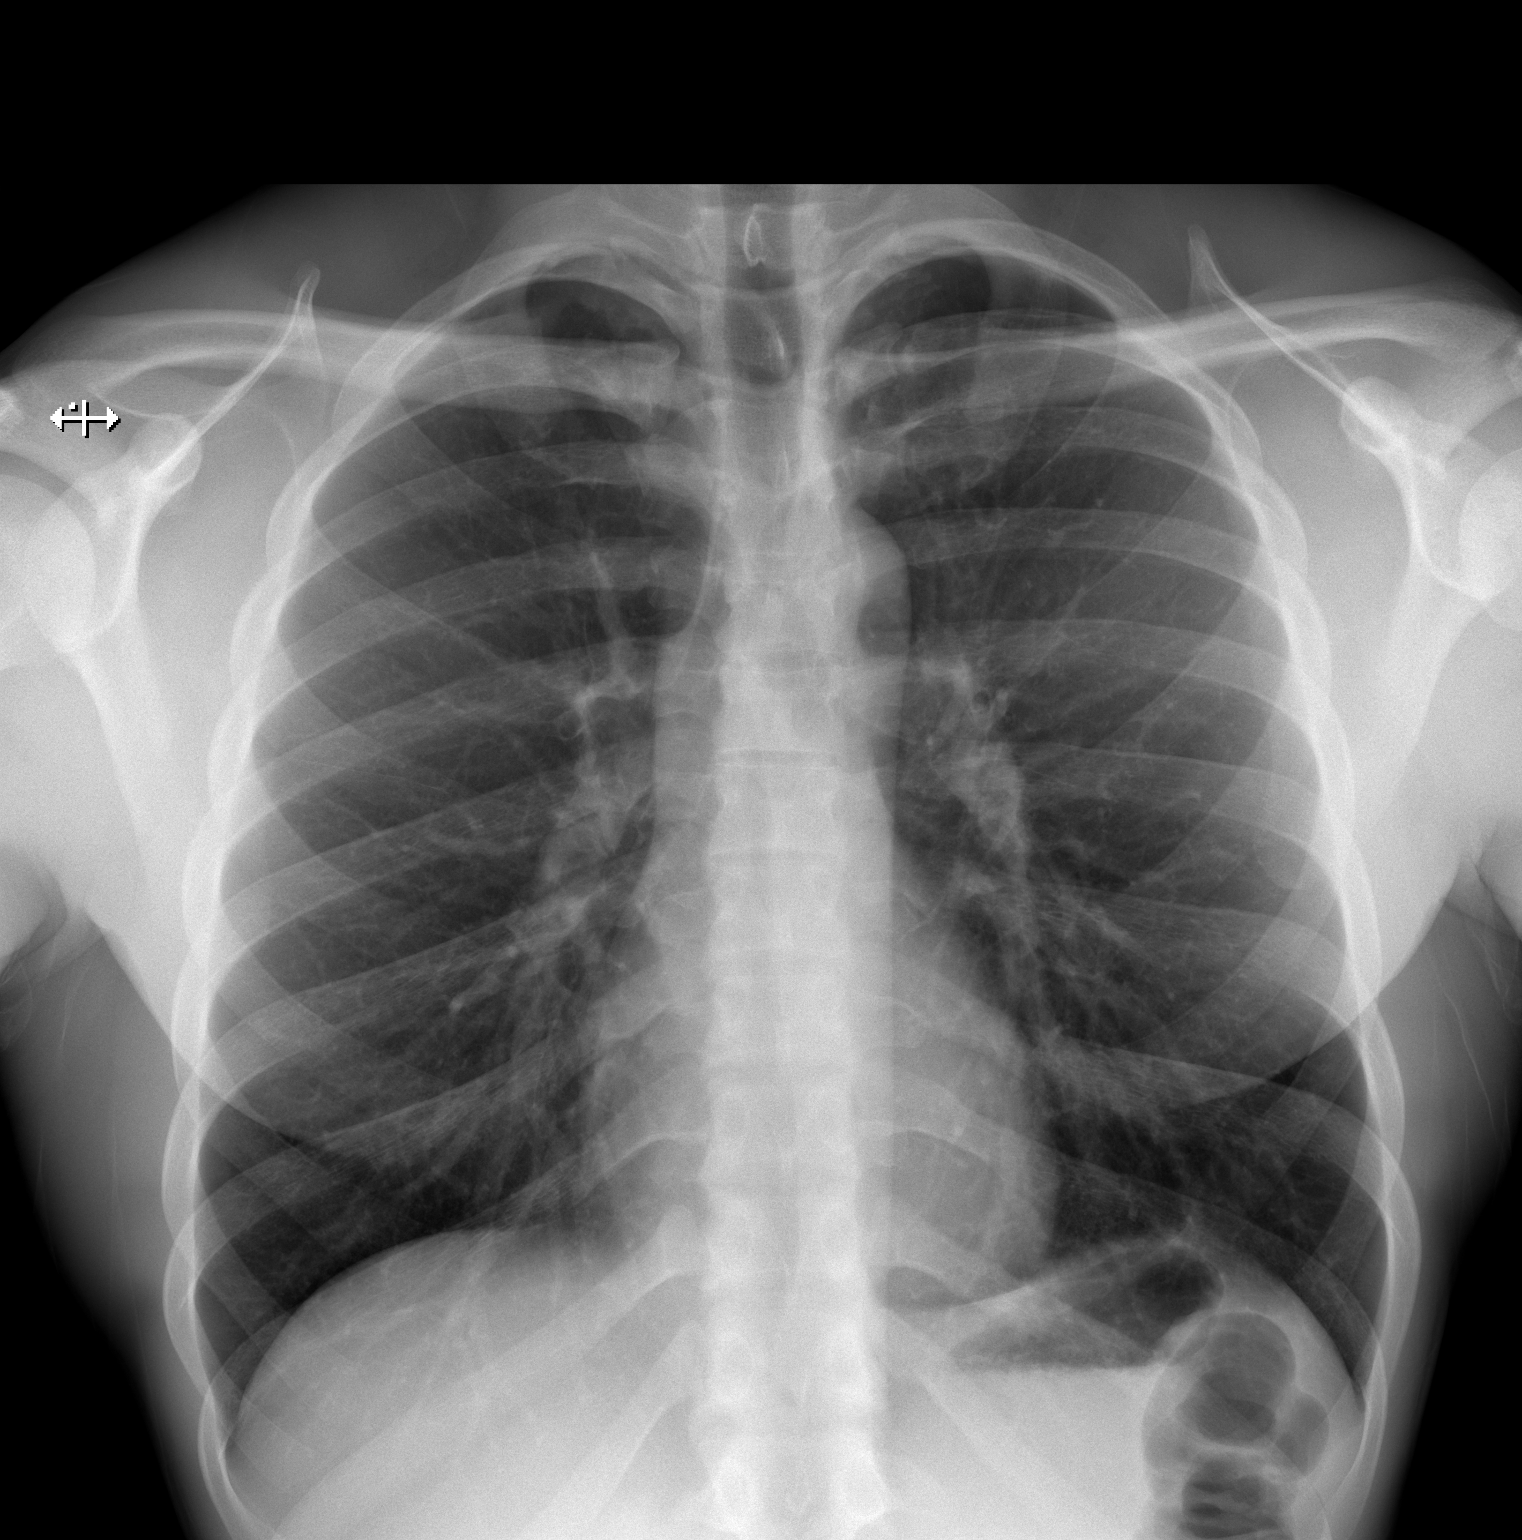

[1 of 1 positions shown; findings below may reference images not displayed]

FINDINGS: The heart size and mediastinal contours are within normal limits.
Both lungs are clear. The visualized skeletal structures are
unremarkable.
IMPRESSION: No active disease.
